# Patient Record
Sex: Male | Born: 1970 | Race: White | Hispanic: No | Marital: Single | Smoking: Never smoker
Health system: Southern US, Community
[De-identification: ages and names within clinical notes are randomized; demographics above are authoritative.]

---
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
## 1998-06-30 ENCOUNTER — Encounter: Admission: RE | Admit: 1998-06-30 | Discharge: 1998-09-22 | Payer: Self-pay | Admitting: Anesthesiology

## 2016-04-25 ENCOUNTER — Emergency Department (HOSPITAL_COMMUNITY)
Admission: EM | Admit: 2016-04-25 | Discharge: 2016-04-25 | Disposition: A | Discharge status: Home / Self Care | Payer: 59 | Attending: Emergency Medicine | Admitting: Emergency Medicine

## 2016-04-25 ENCOUNTER — Encounter (HOSPITAL_COMMUNITY): Payer: Self-pay | Admitting: *Deleted

## 2016-04-25 ENCOUNTER — Emergency Department (HOSPITAL_COMMUNITY): Payer: 59

## 2016-04-25 DIAGNOSIS — Y9389 Activity, other specified: Secondary | ICD-10-CM | POA: Insufficient documentation

## 2016-04-25 DIAGNOSIS — Y999 Unspecified external cause status: Secondary | ICD-10-CM | POA: Insufficient documentation

## 2016-04-25 DIAGNOSIS — Y92410 Unspecified street and highway as the place of occurrence of the external cause: Secondary | ICD-10-CM | POA: Diagnosis not present

## 2016-04-25 DIAGNOSIS — M25531 Pain in right wrist: Secondary | ICD-10-CM | POA: Diagnosis present

## 2016-04-25 DIAGNOSIS — S60221A Contusion of right hand, initial encounter: Secondary | ICD-10-CM | POA: Insufficient documentation

## 2016-04-25 DIAGNOSIS — Z7982 Long term (current) use of aspirin: Secondary | ICD-10-CM | POA: Insufficient documentation

## 2016-04-25 MED ORDER — IBUPROFEN 600 MG PO TABS: 600.0000 mg | ORAL_TABLET | Freq: Four times a day (QID) | ORAL | 0 refills | Status: DC | PRN

## 2016-04-25 NOTE — Discharge Instructions (Signed)
I recommend taking 600 mg every 6 hours as needed for pain relief. He may also apply ice to affected areas for 15-20 minutes 3-4 times daily to help with pain and swelling. Please follow up with a primary care provider from the Resource Guide provided below in 1 week if your symptoms have not improved. Please return to the Emergency Department if symptoms worsen or new onset of fever, redness, swelling, warmth, numbness, tingling.

## 2016-04-25 NOTE — ED Notes (Signed)
Patient transported to X-ray 
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
## 2018-11-26 ENCOUNTER — Telehealth: Payer: Self-pay | Admitting: Neurology

## 2018-11-26 ENCOUNTER — Ambulatory Visit: Payer: BLUE CROSS/BLUE SHIELD | Admitting: Neurology

## 2018-11-26 ENCOUNTER — Other Ambulatory Visit: Payer: Self-pay

## 2018-11-26 ENCOUNTER — Encounter: Payer: Self-pay | Admitting: Neurology

## 2018-11-26 VITALS — BP 126/82 | HR 93 | Ht 70.0 in | Wt 187.0 lb

## 2018-11-26 DIAGNOSIS — M7918 Myalgia, other site: Secondary | ICD-10-CM

## 2018-11-26 DIAGNOSIS — Q8501 Neurofibromatosis, type 1: Secondary | ICD-10-CM | POA: Diagnosis not present

## 2018-11-26 DIAGNOSIS — R5382 Chronic fatigue, unspecified: Secondary | ICD-10-CM

## 2018-11-26 DIAGNOSIS — F988 Other specified behavioral and emotional disorders with onset usually occurring in childhood and adolescence: Secondary | ICD-10-CM | POA: Diagnosis not present

## 2018-11-26 NOTE — Telephone Encounter (Signed)
Pt presents today to discuss concerns that he has been struggling with for several years but has worsened over the last 7 mths. Patient's referral today was sent over to be addressed with a neuromuscular specialist. Due to the patient having a upcoming sleep consult appointment with Dr Vickey Huger the patient was placed to see Dr Vickey Huger. Dr Dohmeier went ahead and addressed the patient sleep consult today and would like the patient to come back to assess the neuromuscular concern that he is having since the referral specified this. Dr Dohmeier has spoken with Dr Terrace Arabia and Dr Terrace Arabia was agreeable to seeing the patient for the neuromuscular concern while Dr Vickey Huger will continue to follow for the sleep related concern. Apt will be cancelled for April 23,2020 sleep consult and the patient will be scheduled as a new patient with Dr Terrace Arabia. Paper referral was given to [facility] in referral department to hold until the patient comes in for that apt. Pt verbalized understanding that we would call to get him rescheduled with Dr Terrace Arabia for that part.

## 2018-11-26 NOTE — Progress Notes (Signed)
SLEEP MEDICINE CLINIC   Provider:  Melvyn Novas, MD   Primary Care Physician:  Baldo Ash, FNP   Referring Provider: Baldo Ash, FNP    Chief Complaint  Patient presents with  . New Patient (Initial Visit)      . Other    pt also had apt in april for sleep consult due to the fatigue concerns, he states that he has had difficulty with sleep but thinks its related to the restlessness in legs.     HPI:  Jacob Diaz is a 48 y.o. male patient, seen here on 11-26-2018 as in a referral  from FNP [facility] for a sleep consultation.  The patient also as a concern about muscle soreness, aching- especially between the ribs. Mentions also RLS. Wife noted him snoring. He has not gained or lost weight. His wife is not sure if she ever observed apnea. He sleeps in a separate room- due to needing a different mattress for back pain reason.   Chief complaint according to patient : "I don't sleep restful, a weighted blanket helped, but usually I kick the covers off" .   Sleep habits are as follows: Dinner time is at 6.30 PM, and bedtime is at 10.30 Pm . The bedroom is cool, quiet and dark, no electronics in the bedroom. It takes 30 minutes plus to fall asleep. No pillow in use- He sleep in any position, has discomfort. He rises at 6 AM, feeling rough. He uses CBD oil to help to quiet his mind and reduces his pain.   Sleep medical history: RLS ? , soreness, achiness, shoulder , spine and thoracic muscles. 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 May have ADD.   Family sleep history:  Parents without muscle disorders,   Social history: son in law of Leone Brand, daughter has NF 1-optic glionma, moya- moya. He is married to a Runner, broadcasting/film/video, has one daughter age 44 year, never smoked.    Review of Systems: Out of a complete 14 system review, the patient complains of only the following symptoms, and all other reviewed systems are negative.   I feel fatigued and sore .   NF 1 in daughter .    Epworth score 3/ 24  , Fatigue severity score 52/ 63   , depression score n/a   Snorer.  Social History   Socioeconomic History  . Marital status: Single    Spouse name: Not on file  . Number of children: Not on file  . Years of education: Not on file  . Highest education level: Not on file  Occupational History  . Not on file  Social Needs  . Financial resource strain: Not on file  . Food insecurity:    Worry: Not on file    Inability: Not on file  . Transportation needs:    Medical: Not on file    Non-medical: Not on file  Tobacco Use  . Smoking status: Never Smoker  . Smokeless tobacco: Never Used  Substance and Sexual Activity  . Alcohol use: Yes    Comment: 1 drink  . Drug use: Not Currently  . Sexual activity: Not on file  Lifestyle  . Physical activity:    Days per week: Not on file    Minutes per session: Not on file  . Stress: Not on file  Relationships  . Social connections:    Talks on phone: Not on file    Gets together: Not on file    Attends religious service: Not on  file    Active member of club or organization: Not on file    Attends meetings of clubs or organizations: Not on file    Relationship status: Not on file  . Intimate partner violence:    Fear of current or ex partner: Not on file    Emotionally abused: Not on file    Physically abused: Not on file    Forced sexual activity: Not on file  Other Topics Concern  . Not on file  Social History Narrative  . Not on file    Family History  Problem Relation Age of Onset  . Diabetes Father   . High Cholesterol Father   . Hypertension Father     No past medical history on file.  Past Surgical History:  Procedure Laterality Date  . SPLENECTOMY  1980    Current Outpatient Medications  Medication Sig Dispense Refill  . aspirin-acetaminophen-caffeine (EXCEDRIN MIGRAINE) 250-250-65 MG tablet Take 1 tablet by mouth every 6 (six) hours as needed for headache or migraine.    . baclofen  (LIORESAL) 20 MG tablet Take 20 mg by mouth 2 (two) times daily.    Marland Kitchen OVER THE COUNTER MEDICATION CBD OIL 300 mg during day  And 1000 mg at night.    . cetirizine (ZYRTEC) 10 MG tablet Take 10 mg by mouth daily.     No current facility-administered medications for this visit.     Allergies as of 11/26/2018  . (No Known Allergies)    Vitals: BP 126/82   Pulse 93   Ht  (1.778 m)   Wt 187 lb (84.8 kg)   BMI 26.83 kg/m  Last Weight:  Wt Readings from Last 1 Encounters:  11/26/18 187 lb (84.8 kg)   UXL:KGMW mass index is 26.83 kg/m.     Last Height:   Ht Readings from Last 1 Encounters:  11/26/18  (1.778 m)    Physical exam:  General: The patient is awake, alert and appears not in acute distress. The patient is well groomed. Head: Normocephalic, atraumatic. Neck is supple. Mallampati ,  neck circumference:17. Nasal airflow patent . Retrognathia is seen.  Cardiovascular:  Regular rate and rhythm , without  murmurs or carotid bruit, and without distended neck veins. Respiratory: Lungs are clear to auscultation. Skin:  Without evidence of edema, or rash Trunk: BMI is 26.8 . The patient's posture is erect   Neurologic exam : The patient is awake and alert, oriented to place and time.   Memory subjective described as impaired. .   Attention span & concentration ability appears normal to me, but he reported attention problems. Marland Kitchen  Speech is fluent,  without dysarthria, dysphonia or aphasia.  Mood and affect are appropriate.  Cranial nerves: Pupils are equal and briskly reactive to light. F Extraocular movements  in vertical and horizontal planes intact and without nystagmus. Visual fields by finger perimetry are intact. Hearing to finger rub intact.   Facial sensation intact to fine touch.  Facial motor strength is symmetric and tongue and uvula move midline. Shoulder shrug was symmetrical.   Motor exam:   Normal tone, muscle bulk and symmetric strength in all  extremities.  Sensory:  Fine touch, pinprick and vibration were tested in all extremities. Proprioception tested in the upper extremities was normal.  Coordination: Rapid alternating movements in the fingers/hands was normal. Finger-to-nose maneuver normal without evidence of ataxia, dysmetria or tremor.  Gait and station: Patient walks without assistive device and is able unassisted to  climb up to the exam table. Strength within normal limits.  Stance is stable and normal.  Deep tendon reflexes: in the  upper and lower extremities are attenuated, I could not elicit DTRs.    Assessment:  After physical and neurologic examination, review of laboratory studies,  Personal review of imaging studies, reports of other /same  Imaging studies, results of polysomnography and / or neurophysiology testing and pre-existing records as far as provided in visit., my assessment is   1) I have the pleasure of meeting Jacob Diaz. Mcglamery today on 26 November 2018, a 48 year old Caucasian male patient with a history of nonrestorative non-refreshing sleep and soreness and achiness affecting his night comfort.  He reports that he has a lot of muscle tension, but he was completely relaxed during the muscle strength examination here.  His father-in-law recommended him to undergo a sleep evaluation.  He feels that he never gets enough sleep.  He has difficulties to fall asleep he has difficulties to rise from sleep.  He is highly fatigued but not necessarily excessively daytime sleepy.  His wife has noted that he snores.  I think he would be fine undergoing a home sleep test as a screening test for apnea.    I will also refer him to a neuromuscular colleague as requested by his previous neurologist.  He had been seen by [facility]-and was sent especially to [facility] to have a neuromuscular consult. There was a MRI brain done at that office , the interpretation is not known. He was told is was normal.   The  patient was advised of the nature of the diagnosed disorder , the treatment options and the  risks for general health and wellness arising from not treating the condition.   I spent more than 45 minutes of face to face time with the patient.  Greater than 50% of time was spent in counseling and coordination of care. We have discussed the diagnosis and differential and I answered the patient's questions.    Plan:  Treatment plan and additional workup :  HST to screen for sleep apnea, Patient had many blood tests, should get copies.    Melvyn Novas, MD   11/26/2018, 3:16 PM  Certified in Neurology by ABPN Certified in Sleep Medicine by [facility] [address] [place], [place] [address]

## 2018-12-03 ENCOUNTER — Other Ambulatory Visit: Payer: Self-pay

## 2018-12-03 ENCOUNTER — Ambulatory Visit (INDEPENDENT_AMBULATORY_CARE_PROVIDER_SITE_OTHER): Payer: BLUE CROSS/BLUE SHIELD | Admitting: Neurology

## 2018-12-03 DIAGNOSIS — F988 Other specified behavioral and emotional disorders with onset usually occurring in childhood and adolescence: Secondary | ICD-10-CM

## 2018-12-03 DIAGNOSIS — R5382 Chronic fatigue, unspecified: Secondary | ICD-10-CM

## 2018-12-03 DIAGNOSIS — G4733 Obstructive sleep apnea (adult) (pediatric): Secondary | ICD-10-CM

## 2018-12-03 DIAGNOSIS — M7918 Myalgia, other site: Secondary | ICD-10-CM

## 2018-12-05 DIAGNOSIS — F988 Other specified behavioral and emotional disorders with onset usually occurring in childhood and adolescence: Secondary | ICD-10-CM | POA: Insufficient documentation

## 2018-12-05 DIAGNOSIS — G4733 Obstructive sleep apnea (adult) (pediatric): Secondary | ICD-10-CM | POA: Insufficient documentation

## 2018-12-05 DIAGNOSIS — M7918 Myalgia, other site: Secondary | ICD-10-CM | POA: Insufficient documentation

## 2018-12-05 DIAGNOSIS — R5382 Chronic fatigue, unspecified: Secondary | ICD-10-CM | POA: Insufficient documentation

## 2018-12-05 NOTE — Procedures (Signed)
[place], [place] [address] NAME:  [name]                                                              DOB: [birth year] MEDICAL RECORD No: [id number]                                                   DOS: 12/03/2018 REFERRING PHYSICIAN: Baldo Ash, FNP STUDY PERFORMED: [facility] on [employer] HISTORY: Jacob Diaz is a 48 y.o. male patient who was seen on 11-26-2018 in a referral from FNP [facility] for a sleep consultation. He snores, feels very fatigued.  The patient also has a concern about muscle soreness, aching- especially between the ribs. Mentions also RLS. Wife noted him snoring. He has not gained or lost weight. His wife is not sure if she ever observed apnea. He sleeps in a separate room- due to needing a different mattress for back pain reason.    Chief complaint according to patient: "I don't sleep restful, a weighted blanket helped, but usually I kick the covers off".   Epworth Sleepiness Score 3/ 24 points, Fatigue severity score 52/ 63 points, BMI: 26.8 kg/m2.  STUDY RESULTS:  Total Recording Time: 8 h17 min; Calculated Sleep Time:  6 h 41 min. Total Apnea/Hypopnea Index (AHI): 9.3 /h; RDI: 10.3 /h; REM AHI: 21.9 /h. Average Oxygen Saturation: 95 %; Lowest Oxygen Desaturation: 87 %.  Total Time in Oxygen Saturation below 89 %: 0.2 minutes.  Average Heart Rate:  58 bpm (between 40 and 92 bpm). IMPRESSION:  The patient snores moderately but has only a mild form of sleep apnea, which is strongly REM and supine sleep accentuated.  RECOMMENDATION: Dependent on patient's preferences.  Mild apnea can be reduced by not sleeping in supine position. HST doesn't address the symptoms of leg pain, muscle pain, but apnea is so mild that treatment by CPAP is optional.  I certify that I have reviewed the raw data recording prior to the issuance of this report in accordance with the standards of the American Academy of Sleep Medicine (AASM). Melvyn Novas, M.D.   12-05-2018   Medical  Director of [facility], accredited by the AASM. Diplomat of the ABPN and ABSM.

## 2018-12-05 NOTE — Addendum Note (Signed)
Addended by: Melvyn Novas on: 12/05/2018 10:39 AM   Modules accepted: Orders

## 2018-12-09 ENCOUNTER — Telehealth: Payer: Self-pay | Admitting: Neurology

## 2018-12-09 NOTE — Telephone Encounter (Signed)
Pt states his muscle pain in upper and lower back, shoulders, down right thigh is getting worse. He is having difficulty sleeping, increased fatigue. Pt is wanting to know if he can be seen sooner. I have made him aware of the patient flow in the office due to covid-19 and only emergent patients are being seen. He is aware of telemedicine. He is aware the appt may not be able to moved but requested a note sent to RN. Please call to advise

## 2018-12-10 ENCOUNTER — Ambulatory Visit (INDEPENDENT_AMBULATORY_CARE_PROVIDER_SITE_OTHER): Payer: BLUE CROSS/BLUE SHIELD | Admitting: Neurology

## 2018-12-10 ENCOUNTER — Encounter: Payer: Self-pay | Admitting: Neurology

## 2018-12-10 DIAGNOSIS — R5382 Chronic fatigue, unspecified: Secondary | ICD-10-CM | POA: Diagnosis not present

## 2018-12-10 DIAGNOSIS — M791 Myalgia, unspecified site: Secondary | ICD-10-CM

## 2018-12-10 MED ORDER — GABAPENTIN 300 MG PO CAPS: 300.0000 mg | ORAL_CAPSULE | Freq: Three times a day (TID) | ORAL | 11 refills | Status: DC

## 2018-12-10 MED ORDER — GABAPENTIN 300 MG PO CAPS
300.0000 mg | ORAL_CAPSULE | Freq: Three times a day (TID) | ORAL | 11 refills | Status: DC
Start: 2018-12-10 — End: 2019-01-08
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
## 2018-12-10 NOTE — Telephone Encounter (Signed)
He has been scheduled today at 11:45am for a video visit.

## 2018-12-11 ENCOUNTER — Telehealth: Payer: Self-pay | Admitting: Neurology

## 2018-12-11 NOTE — Telephone Encounter (Signed)
Our sleep lab technician called the pt and reviewed his sleep study results with him in detail. Advised of Dr Dohmeier's recommendation and has chosen that he will work on positional therapy at this time. Pt verbalized understanding. Pt had no questions at this time but was encouraged to call back if questions arise.

## 2018-12-11 NOTE — Telephone Encounter (Signed)
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  RECOMMENDATION: Dependent on patient's preferences.  Mild apnea can be reduced by not sleeping in supine position. HST  doesn't address the symptoms of leg pain, muscle pain, but apnea  is so mild that treatment by CPAP is optional.

## 2018-12-15 ENCOUNTER — Telehealth: Payer: Self-pay | Admitting: Neurology

## 2018-12-15 NOTE — Telephone Encounter (Signed)
BCBS Auth: NPR via [employer] order sent to [facility]. They will reach out to the pt to schedule.

## 2018-12-17 ENCOUNTER — Ambulatory Visit
Admission: RE | Admit: 2018-12-17 | Discharge: 2018-12-17 | Disposition: A | Discharge status: Home / Self Care | Payer: BLUE CROSS/BLUE SHIELD | Source: Ambulatory Visit | Attending: Neurology | Admitting: Neurology

## 2018-12-17 ENCOUNTER — Other Ambulatory Visit: Payer: Self-pay

## 2018-12-17 DIAGNOSIS — M791 Myalgia, unspecified site: Secondary | ICD-10-CM

## 2018-12-17 DIAGNOSIS — R5382 Chronic fatigue, unspecified: Secondary | ICD-10-CM

## 2018-12-19 ENCOUNTER — Telehealth: Payer: Self-pay | Admitting: Neurology

## 2018-12-19 DIAGNOSIS — R933 Abnormal findings on diagnostic imaging of other parts of digestive tract: Secondary | ICD-10-CM

## 2018-12-19 NOTE — Telephone Encounter (Signed)
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  Gabapentin 300mg  tid was helpful  Will order CT pelvic.   IMPRESSION:   MRI lumbar spine (without) demonstrating: - L5 vertebral body has transitional features.  Note enumeration scheme. - Posterior decompression and fusion with metallic hardware at L3-L4-L5. - No spinal stenosis or foraminal narrowing.  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  This is partially visualized on axial views but not on sagittal views.  Consider x-ray, CT or nuclear medicine bone scan for further evaluation.

## 2018-12-23 ENCOUNTER — Telehealth: Payer: Self-pay | Admitting: Neurology

## 2018-12-23 NOTE — Telephone Encounter (Signed)
BCBS Auth: NPR via bcbs website order sent to [facility]. They will reach out to the pt to schedule.

## 2019-01-06 ENCOUNTER — Telehealth: Payer: Self-pay | Admitting: Neurology

## 2019-01-06 NOTE — Telephone Encounter (Signed)
I have sent email webex invitation to patient.

## 2019-01-06 NOTE — Telephone Encounter (Signed)
Pt called and stated that he did not want to do a CT scan and would like to schedule a VV with the provider to discuss other options. Pt was scheduled for 01/08/19. Pt has consented to a Virtual Visit and for the insurance to be billed as such. Email was confirmed.

## 2019-01-08 ENCOUNTER — Institutional Professional Consult (permissible substitution): Payer: Self-pay | Admitting: Neurology

## 2019-01-08 ENCOUNTER — Other Ambulatory Visit: Payer: Self-pay

## 2019-01-08 ENCOUNTER — Encounter: Payer: Self-pay | Admitting: Neurology

## 2019-01-08 ENCOUNTER — Ambulatory Visit (INDEPENDENT_AMBULATORY_CARE_PROVIDER_SITE_OTHER): Payer: BLUE CROSS/BLUE SHIELD | Admitting: Neurology

## 2019-01-08 DIAGNOSIS — R202 Paresthesia of skin: Secondary | ICD-10-CM | POA: Diagnosis not present

## 2019-01-08 DIAGNOSIS — M542 Cervicalgia: Secondary | ICD-10-CM | POA: Diagnosis not present

## 2019-01-08 MED ORDER — GABAPENTIN 300 MG PO CAPS
600.0000 mg | ORAL_CAPSULE | Freq: Three times a day (TID) | ORAL | 11 refills | Status: DC
Start: 2019-01-08 — End: 2019-01-08

## 2019-01-08 MED ORDER — GABAPENTIN 300 MG PO CAPS: 600.0000 mg | ORAL_CAPSULE | Freq: Three times a day (TID) | ORAL | 11 refills | Status: DC

## 2019-01-08 NOTE — Progress Notes (Signed)
Virtual Visit via Video  I connected with Di KindleWilliam J Spratlin on 01/08/19 at  by Video and verified that I am speaking with the correct person using two identifiers.   I discussed the limitations, risks, security and privacy concerns of performing an evaluation and management service by Video and the availability of in person appointments. I also discussed with the patient that there may be a patient responsible charge related to this service. The patient expressed understanding and agreed to proceed.   History of Present Illness:  Mr. Lockie ParesHendrick is a 48 year old male, referred by my colleague Dr. Vickey Hugerohmeier for evaluation of muscle achy pain, hhe was seen by Dr. Vickey Hugerohmeier on November 26, 2018 for his sleep problem.  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  He reported more than 25 years ago history of diffuse muscle achy pain, fatigue, gradually getting worse, he can still workout regularly, including ladder climbing for 10 minutes without any difficulty, the more he climbs, the more relaxed he feel, afterwards, he usually followed by muscle stretching, he had a constant achy tension muscles in his shoulders, between shoulder blade, upper and lower back, and also bilateral thigh area,  He denies persistent sensory loss, no gait abnormality, no difficulty using arms, no bowel and bladder incontinence, no bulbar weakness,  He was seen by [facility] in January 2020, MRI of the brain with without contrast [facility] in February 2020 was reported normal, also reported normal laboratory evaluation in January 2020.  He was started on baclofen 20 mg twice daily since January, reported some improvement especially his lower extremity pain, but he continues to have significant muscle tension between his spines, radiating pain to bilateral lower extremity.  He also carries a diagnosis of restless leg  syndromes, urge to move his legs after sitting, trying to go to sleep.  Virtual Visit via Video  I connected with Di KindleWilliam J Seabolt on 01/08/19 at  by Video and verified that I am speaking with the correct person using two identifiers.   I discussed the limitations, risks, security and privacy concerns of performing an evaluation and management service by video and the availability of in person appointments. I also discussed with the patient that there may be a patient responsible charge related to this service. The patient expressed understanding and agreed to proceed.   History of Present Illness: He continues to complains of midline low back pain, no radiating pain to bilateral lower extremity, despite taking baclofen 20 mg twice a day, gabapentin 300 mg 3 times daily initially was helpful,  He also complains of right-sided neck pain radiating pain to right shoulder, to right lateral 3 fingers, intermittent, difficulty using his right hand sometimes,   Observations/Objective: I have reviewed problem lists, medications, allergies. I personally reviewed MRI of lumbar on December 18, 2018, posterior decompression and fusion with metallic hardware at L3, 4, 5, no significant canal or foraminal narrowing, right ilium bone has T1 hypo-intense, and T2 hyperintense lesion 1.8 x 3.1 cm, this is partially visualized on axial views, but not on sagittal views,  CT of pelvic was ordered,  Assessment and Plan: Chronic low back pain,  History of lumbar decompression surgery, repeat MRI of lumbar showed no significant evidence of canal or foraminal narrowing, has metallic hardware L L3-4-5, posterior decompression fusion,  Continue baclofen 20 mg twice a day, increase gabapentin up to 300 mg 2 tablets 3  times a day, may mix together with as needed Aleve  New onset right-sided neck pain radiating pain to right upper extremity, right first 3 finger paresthesia  Differentiation diagnosis include right  cervical radiculopathy, versus right carpal tunnel syndromes,  After discussed with patient he desires further evaluation, will proceed with MRI of cervical spine, EMG nerve conduction study  Follow Up Instructions:  3 months    I discussed the assessment and treatment plan with the patient. The patient was provided an opportunity to ask questions and all were answered. The patient agreed with the plan and demonstrated an understanding of the instructions.   The patient was advised to call back or seek an in-person evaluation if the symptoms worsen or if the condition fails to improve as anticipated.  I provided 25 minutes of non-face-to-face time during this encounter.   Levert Feinstein, MD

## 2019-01-19 ENCOUNTER — Telehealth: Payer: Self-pay | Admitting: Neurology

## 2019-01-19 NOTE — Telephone Encounter (Signed)
no to the covid-19 questions  BCBS Auth: NPR via BCBS webstie. Patient is scheduled for 01/27/19 at [facility].

## 2019-01-21 ENCOUNTER — Institutional Professional Consult (permissible substitution): Payer: BLUE CROSS/BLUE SHIELD | Admitting: Neurology

## 2019-01-27 ENCOUNTER — Other Ambulatory Visit: Payer: Self-pay

## 2019-01-27 ENCOUNTER — Ambulatory Visit: Payer: BLUE CROSS/BLUE SHIELD

## 2019-01-27 DIAGNOSIS — M542 Cervicalgia: Secondary | ICD-10-CM | POA: Diagnosis not present

## 2019-01-27 DIAGNOSIS — R202 Paresthesia of skin: Secondary | ICD-10-CM

## 2019-01-29 ENCOUNTER — Telehealth: Payer: Self-pay | Admitting: *Deleted

## 2019-01-29 NOTE — Telephone Encounter (Signed)
-----   Message from Levert Feinstein, MD sent at 01/29/2019  4:09 PM EDT ----- Please call pt for normal MRI cervical spine.

## 2019-01-29 NOTE — Telephone Encounter (Signed)
I was able to speak with patient and he is aware of his results.

## 2019-02-10 ENCOUNTER — Telehealth: Payer: Self-pay | Admitting: Neurology

## 2019-02-10 NOTE — Telephone Encounter (Signed)
Patient calling to schedule NCV/ EMG

## 2019-02-11 NOTE — Telephone Encounter (Signed)
I tried calling patient to schedule his NCV/EMG with Dr. Terrace Arabia, but patient did not answer and VM box has not been set up. If patient calls back, please schedule NCV/EMG with Dr. Terrace Arabia.

## 2019-02-11 NOTE — Telephone Encounter (Signed)
Duplicate encounter, see previous phone notes

## 2019-02-11 NOTE — Telephone Encounter (Signed)
Pt has been scheduled for the NCV/EMG and has also been added to the wait list. He would like to come in sooner if something becomes available.

## 2019-02-11 NOTE — Telephone Encounter (Signed)
Noted. I have added him to my own wait list as well and will call if something becomes available sooner.

## 2019-03-11 ENCOUNTER — Telehealth: Payer: Self-pay | Admitting: Neurology

## 2019-03-11 ENCOUNTER — Ambulatory Visit (INDEPENDENT_AMBULATORY_CARE_PROVIDER_SITE_OTHER): Payer: BC Managed Care – PPO | Admitting: Neurology

## 2019-03-11 ENCOUNTER — Other Ambulatory Visit: Payer: Self-pay

## 2019-03-11 DIAGNOSIS — M791 Myalgia, unspecified site: Secondary | ICD-10-CM

## 2019-03-11 DIAGNOSIS — M542 Cervicalgia: Secondary | ICD-10-CM

## 2019-03-11 DIAGNOSIS — R202 Paresthesia of skin: Secondary | ICD-10-CM

## 2019-03-11 DIAGNOSIS — Z0289 Encounter for other administrative examinations: Secondary | ICD-10-CM

## 2019-03-11 MED ORDER — DULOXETINE HCL 60 MG PO CPEP: 60.0000 mg | ORAL_CAPSULE | Freq: Every day | ORAL | 11 refills | Status: AC

## 2019-03-11 NOTE — Progress Notes (Signed)
Called in cymbalta 60mg  daily

## 2019-03-11 NOTE — Procedures (Signed)
Full Name: Jacob Diaz Gender: Male MRN #: [id number] Date of Birth: 08/17/1971    Visit Date: 03/11/2019 09:05 Age: [age] Years 8 Months Old Examining Physician: Levert FeinsteinYijun [name], MD  Referring Physician: Levert FeinsteinYijun Shereda Graw, MD History:   48 years old male presented with diffuse body achy pain.  Summary of the tests:  Nerve conduction study: Bilateral median, ulnar sensory motor responses were normal.  Right median mixed response was 0.7 ms prolonged in comparison to the ipsilateral ulnar response.  Left radial sensory responses were normal.  Left sural, superficial peroneal sensory responses were normal. Left tibial, peroneal to EDB motor responses were normal.  Electromyography: Selected needle examinations were performed at right upper, lower extremity muscles, right cervical, lumbosacral paraspinal muscles.  That was normal.  Conclusion: This is a mild abnormal study.  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   ------------------------------- Levert FeinsteinYIjun Jomel Whittlesey, M.D. PhD  [facility] [address] [place], [place] [address] Tel: [phone] Fax: [phone]        [facility]    Nerve / Sites Muscle Latency Ref. Amplitude Ref. Rel Amp Segments Distance Velocity Ref. Area    ms ms mV mV %  cm m/s m/s mVms  L Median - APB     Wrist APB 3.8 ?4.4 4.9 ?4.0 100 Wrist - APB 7   18.1     Upper arm APB 7.8  4.6  95.2 Upper arm - Wrist 23 57 ?49 18.5  R Median - APB     Wrist APB 4.3 ?4.4 4.1 ?4.0 100 Wrist - APB 7   13.5     Upper arm APB 8.6  4.0  98.9 Upper arm - Wrist 25 58 ?49 13.6  L Ulnar - ADM     Wrist ADM 2.2 ?3.3 9.3 ?6.0 100 Wrist - ADM 7   35.7     B.Elbow ADM 5.8  8.8  94.1 B.Elbow - Wrist 19 54 ?49 35.2     A.Elbow ADM 8.2  8.7  98.5 A.Elbow - B.Elbow 10 41 ?49 35.1         A.Elbow - Wrist      R Ulnar - ADM     Wrist ADM 2.3 ?3.3 8.6 ?6.0 100 Wrist - ADM 7   29.2     B.Elbow ADM 5.9  8.6  100 B.Elbow - Wrist 20 55 ?49 29.4     A.Elbow ADM 8.4  8.3  95.8 A.Elbow - B.Elbow 10 41 ?49 29.6         A.Elbow - Wrist      L Peroneal - EDB     Ankle EDB 4.6 ?6.5 5.2 ?2.0 100 Ankle - EDB 9   16.0     Fib head EDB 11.4  5.0  95.6 Fib head - Ankle 30 45 ?44 18.3     Pop fossa EDB 13.6  4.0  81.2 Pop fossa - Fib head 10 44 ?44 14.9         Pop fossa - Ankle      L Tibial - AH     Ankle AH 4.5 ?5.8 6.2 ?4.0 100 Ankle - AH 9   12.4     Pop fossa AH 13.6  5.5  89.4 Pop fossa - Ankle 37 41 ?41 12.7  [place]    Nerve / Sites Rec. Site Peak Lat Ref.  Amp Ref. Segments Distance Peak Diff Ref.    ms ms V V  cm ms ms  L Radial - Anatomical snuff box (Forearm)     Forearm Wrist 2.4 ?2.9 15 ?15 Forearm - Wrist 10    R Radial - Anatomical snuff box (Forearm)     Forearm Wrist 2.4 ?2.9 15 ?15 Forearm - Wrist 10    L Sural - Ankle (Calf)     Calf Ankle 3.4 ?4.4 12 ?6 Calf - Ankle 14    L Superficial peroneal - Ankle     Lat leg Ankle 4.0 ?4.4 7 ?6 Lat leg - Ankle 14    L Median, Ulnar - Transcarpal comparison     Median Palm Wrist 2.1 ?2.2 45 ?35 Median Palm - Wrist 8       Ulnar Palm Wrist 2.1 ?2.2 14 ?12 Ulnar Palm - Wrist 8          Median Palm - Ulnar Palm  0.0 ?0.4  R Median, Ulnar - Transcarpal comparison     Median Palm Wrist 2.6 ?2.2 35 ?35 Median Palm - Wrist 8       Ulnar Palm Wrist 1.9 ?2.2 13 ?12 Ulnar Palm - Wrist 8          Median Palm - Ulnar Palm  0.7 ?0.4  L Median - Orthodromic (Dig II, Mid palm)     Dig II Wrist 2.9 ?3.4 14 ?10 Dig II - Wrist 13    R Median - Orthodromic (Dig II, Mid palm)     Dig II Wrist 3.4 ?3.4 10 ?10 Dig II - Wrist 13    L Ulnar - Orthodromic, (Dig V, Mid palm)     Dig V Wrist 2.8 ?3.1 7 ?5 Dig V - Wrist 11    R Ulnar - Orthodromic, (Dig V, Mid palm)     Dig V Wrist 2.6 ?3.1 6 ?5 Dig V - Wrist 4                              F  Wave    Nerve F Lat Ref.   ms ms  L Ulnar - ADM 31.1 ?32.0  R Ulnar - ADM 29.4 ?32.0  L Tibial - AH 54.7 ?56.0           EMG       EMG Summary Table    Spontaneous MUAP Recruitment  Muscle IA Fib PSW Fasc Other Amp Dur. Poly Pattern  R. Tibialis anterior Normal None None None _______ Normal Normal Normal Normal  R. Tibialis posterior Normal None None None _______ Normal Normal Normal Normal  R. Peroneus longus Normal None None None _______ Normal Normal Normal Normal  R. Vastus lateralis Normal None None None _______ Normal Normal Normal Normal  R. Lumbar paraspinals (mid) Normal None None None _______ Normal Normal Normal Normal  R. Lumbar paraspinals (low) Normal None None None _______ Normal Normal Normal Normal  R. First dorsal interosseous Normal None None None _______ Normal Normal Normal Normal  R. Pronator teres Normal None None None _______ Normal Normal Normal Normal  R. Extensor digitorum communis Normal None None None _______ Normal Normal Normal Normal  R. Biceps brachii Normal None None None _______ Normal Normal Normal Normal  R. Deltoid Normal None None None _______ Normal Normal Normal Normal  R. Cervical paraspinals Normal None  None None _______ Normal Normal Normal Normal

## 2019-03-11 NOTE — Telephone Encounter (Signed)
Pt called in and stated he was told there would be a script for Cymbalta sent to the pharmacy , he is wondering if can get a call back once its done

## 2019-03-11 NOTE — Telephone Encounter (Signed)
Dr. Krista Blue has sent in duloxetine 60mg , one tablet daily for the patient.  I have called to let him know and he will check with his pharmacy.

## 2019-04-10 ENCOUNTER — Other Ambulatory Visit: Payer: Self-pay | Admitting: Family Medicine

## 2019-04-10 DIAGNOSIS — M5414 Radiculopathy, thoracic region: Secondary | ICD-10-CM

## 2019-04-29 ENCOUNTER — Other Ambulatory Visit: Payer: Self-pay

## 2019-04-29 ENCOUNTER — Ambulatory Visit
Admission: RE | Admit: 2019-04-29 | Discharge: 2019-04-29 | Disposition: A | Discharge status: Home / Self Care | Payer: BC Managed Care – PPO | Source: Ambulatory Visit | Attending: Family Medicine | Admitting: Family Medicine

## 2019-04-29 DIAGNOSIS — M5414 Radiculopathy, thoracic region: Secondary | ICD-10-CM

## 2020-02-08 ENCOUNTER — Other Ambulatory Visit: Payer: Self-pay | Admitting: Neurology

## 2020-03-17 ENCOUNTER — Other Ambulatory Visit: Payer: Self-pay | Admitting: Neurology

## 2020-06-08 ENCOUNTER — Other Ambulatory Visit: Payer: Self-pay | Admitting: Neurology

## 2020-07-26 ENCOUNTER — Other Ambulatory Visit: Payer: Self-pay | Admitting: Neurology

## 2021-05-22 IMAGING — MR MRI THORACIC SPINE WITHOUT CONTRAST
4 of 6 series · 22 of 48 positions shown · non-contrast
Comparison: None.

CLINICAL DATA: Chronic mid back pain and numbness for the past
year.

EXAM:
MRI THORACIC SPINE WITHOUT CONTRAST
TECHNIQUE: Multiplanar, multisequence MR imaging of the thoracic spine was
performed. No intravenous contrast was administered.

[Series 17: T1 · sagittal · 3.0mm · 0.89mm/px · 4 of 15 slices shown]
[im 1/15]
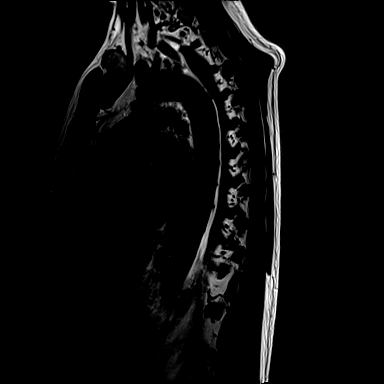
[im 3/15]
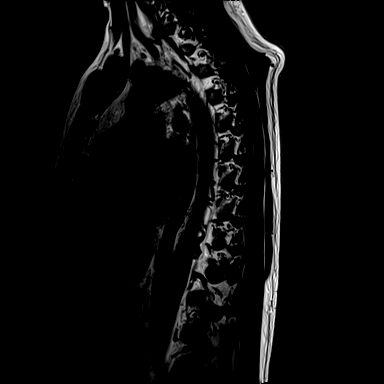
[im 9/15]
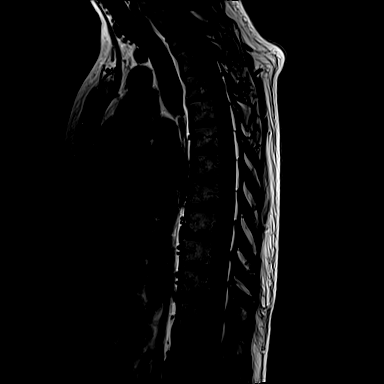
[im 15/15]
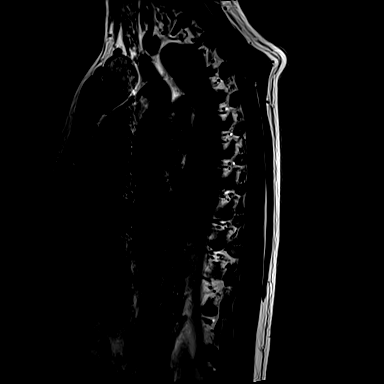

[Series 18: STIR · sagittal · 3.0mm · 1.06mm/px · 3 of 15 slices shown]
[im 3/15]
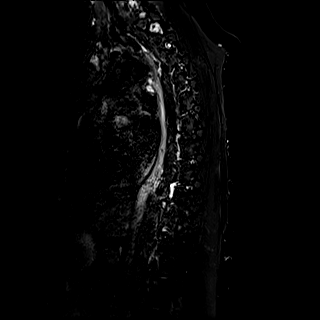
[im 9/15]
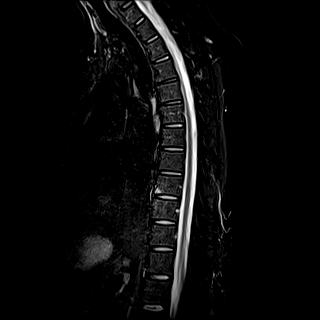
[im 15/15]
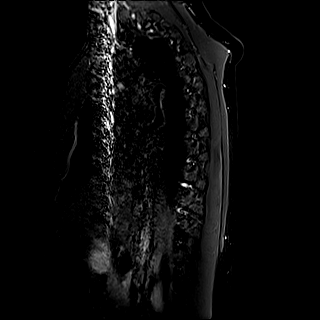

[Series 19: T2 · sagittal · 3.0mm · 0.89mm/px · 6 of 15 slices shown (1 of 2)]
[im 1/15]
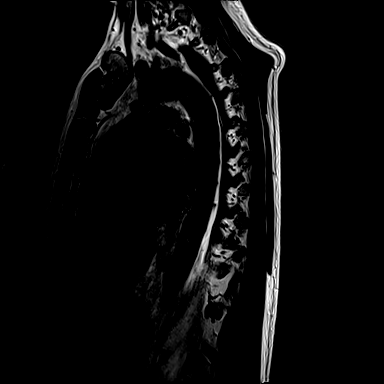
[im 3/15]
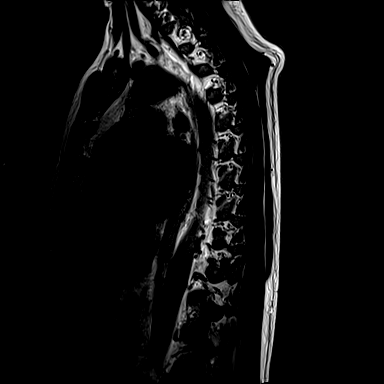
[im 6/15]
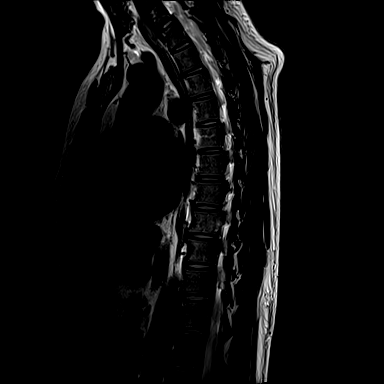
[im 9/15]
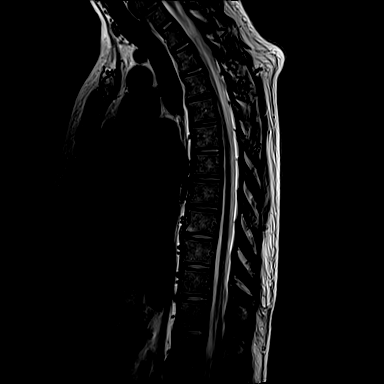
[im 12/15]
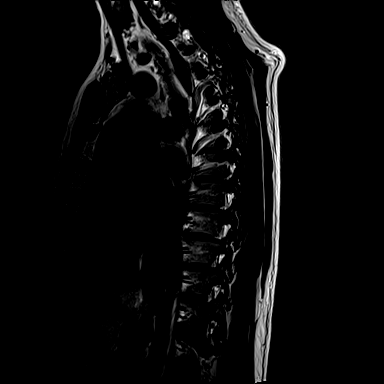
[im 15/15]
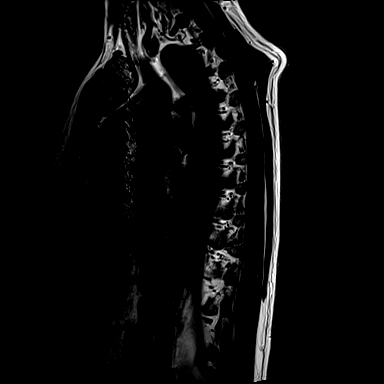

[Series 21: T2 · axial · 4.0mm · 0.28mm/px · z∈[-309,-53]mm · 9 of 39 slices shown (2 of 2)]
[im 1/39]
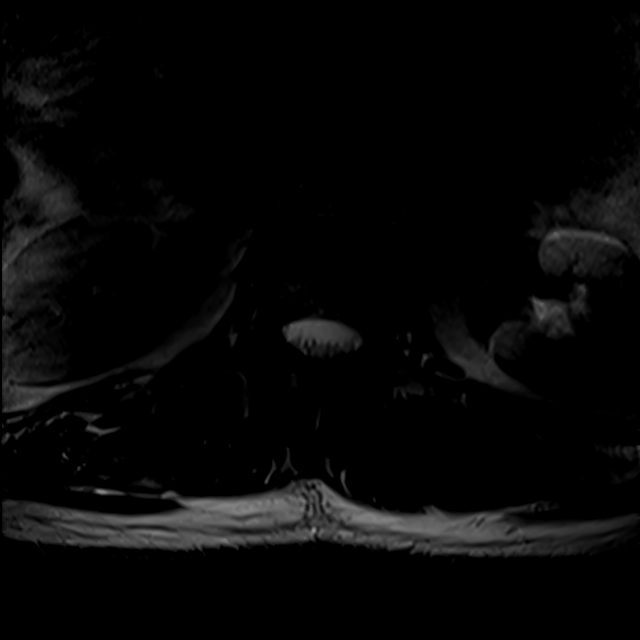
[im 6/39]
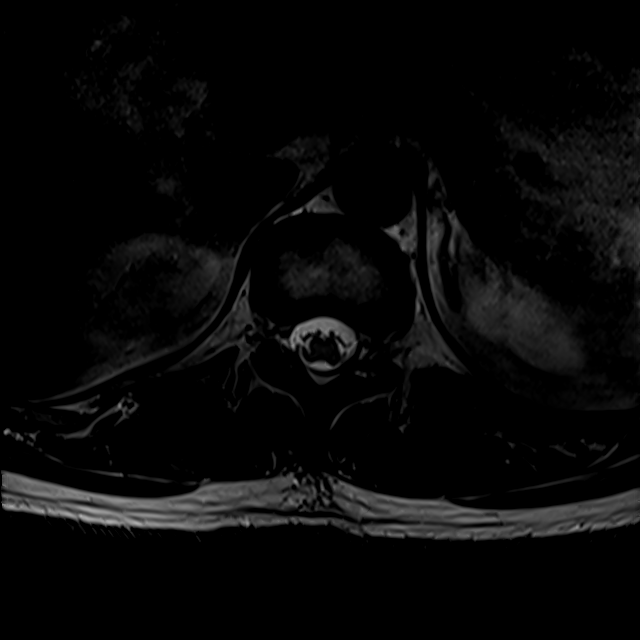
[im 11/39]
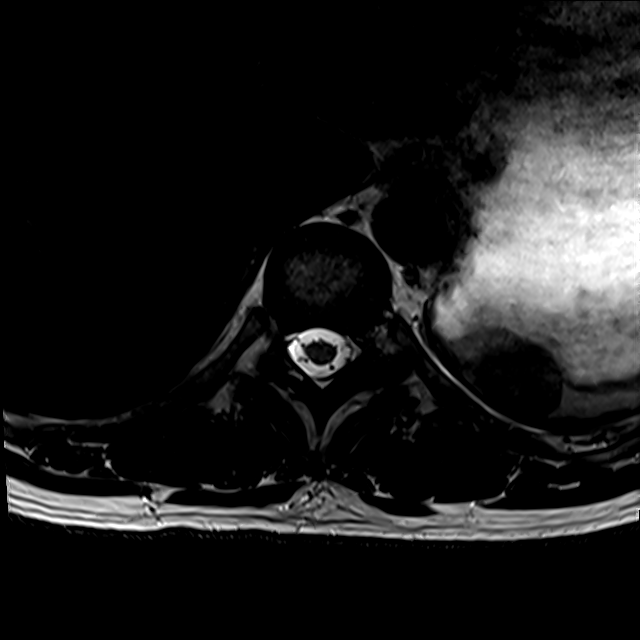
[im 17/39]
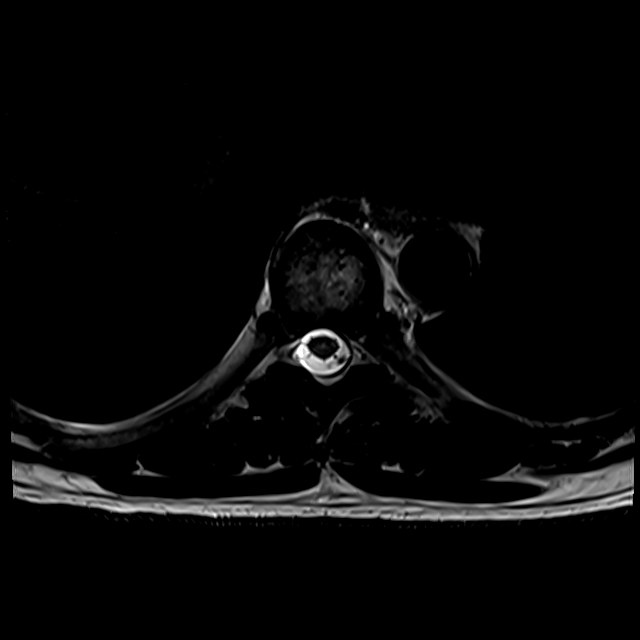
[im 20/39]
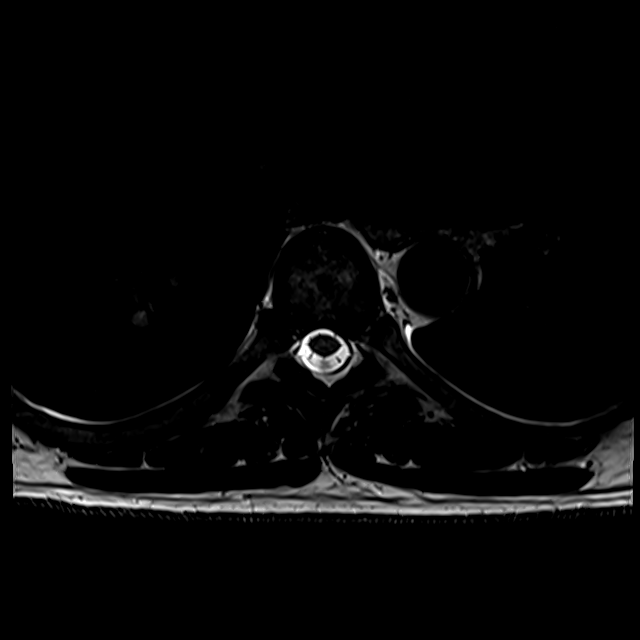
[im 22/39]
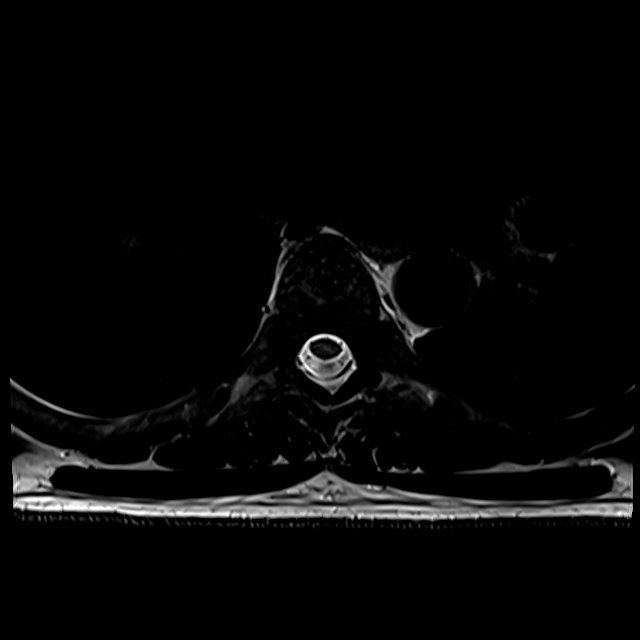
[im 28/39]
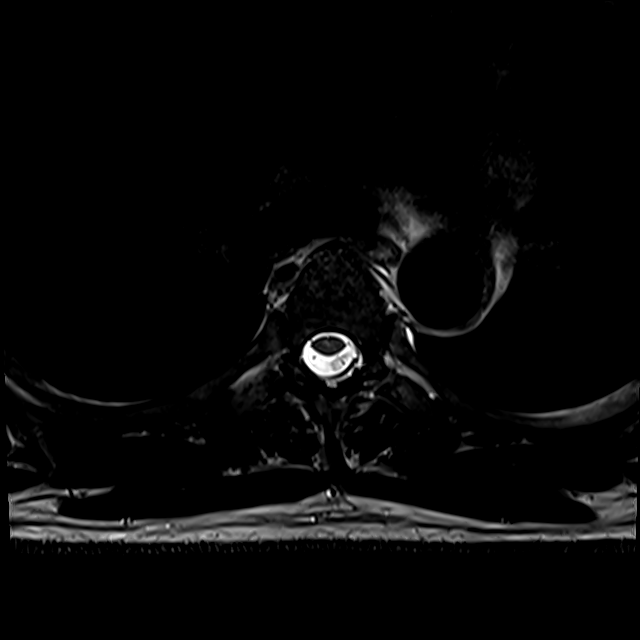
[im 33/39]
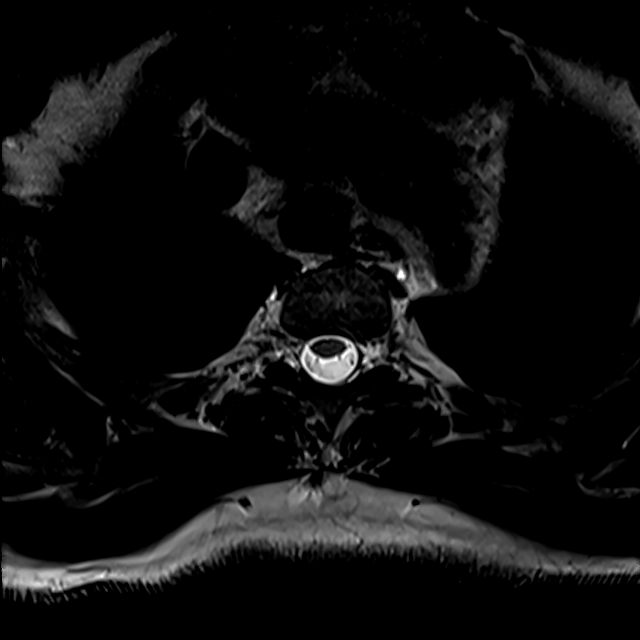
[im 39/39]
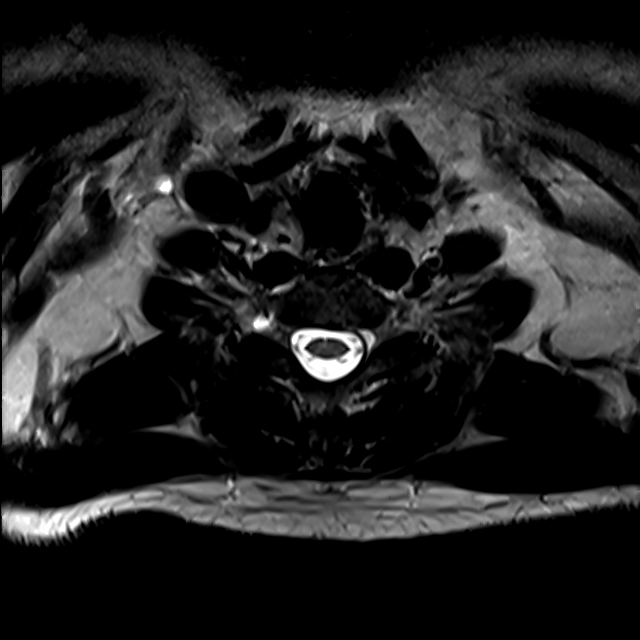

[22 of 48 positions shown; findings below may reference images not displayed]

FINDINGS: Alignment:  Physiologic.

Vertebrae: No fracture, evidence of discitis, or bone lesion.
Minimal degenerative marrow edema and spurring of the T8 anterior
inferior endplate.

Cord:  Normal signal and morphology.

Paraspinal and other soft tissues: Negative.

Disc levels:

Preserved disc heights and hydration. No significant disc bulge or
herniation. No spinal canal or neuroforaminal stenosis.
IMPRESSION: 1. No acute abnormality. No significant degenerative changes or
stenosis.

## 2024-04-28 ENCOUNTER — Other Ambulatory Visit (HOSPITAL_COMMUNITY): Payer: Self-pay | Admitting: Psychiatry

## 2024-04-28 ENCOUNTER — Telehealth (HOSPITAL_COMMUNITY): Payer: Self-pay | Admitting: Family Medicine

## 2024-04-28 NOTE — Telephone Encounter (Signed)
 3:00pm 04/28/24 Spoke withe patient in reference to testing before his appt in Sept. - [facility], Dr. Norleen Asa and [facility]. Patient aware to call once test is schedule.

## 2024-05-26 ENCOUNTER — Ambulatory Visit (HOSPITAL_COMMUNITY): Payer: Self-pay | Admitting: Psychiatry
